# Patient Record
Sex: Female | Born: 1990 | Race: White | Hispanic: No | Marital: Single | Smoking: Never smoker
Health system: Southern US, Community
[De-identification: ages and names within clinical notes are randomized; demographics above are authoritative.]

## PROBLEM LIST (undated history)

## (undated) DIAGNOSIS — F410 Panic disorder [episodic paroxysmal anxiety] without agoraphobia: Secondary | ICD-10-CM

## (undated) HISTORY — DX: Panic disorder (episodic paroxysmal anxiety): F41.0

---
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
## 2011-05-24 IMAGING — CR DG CHEST 2V
2 series · 2 of 2 positions shown · non-contrast
Comparison: None

CLINICAL DATA: Chest pain, anxiety.

CHEST - 2 VIEW

[w chest pa]
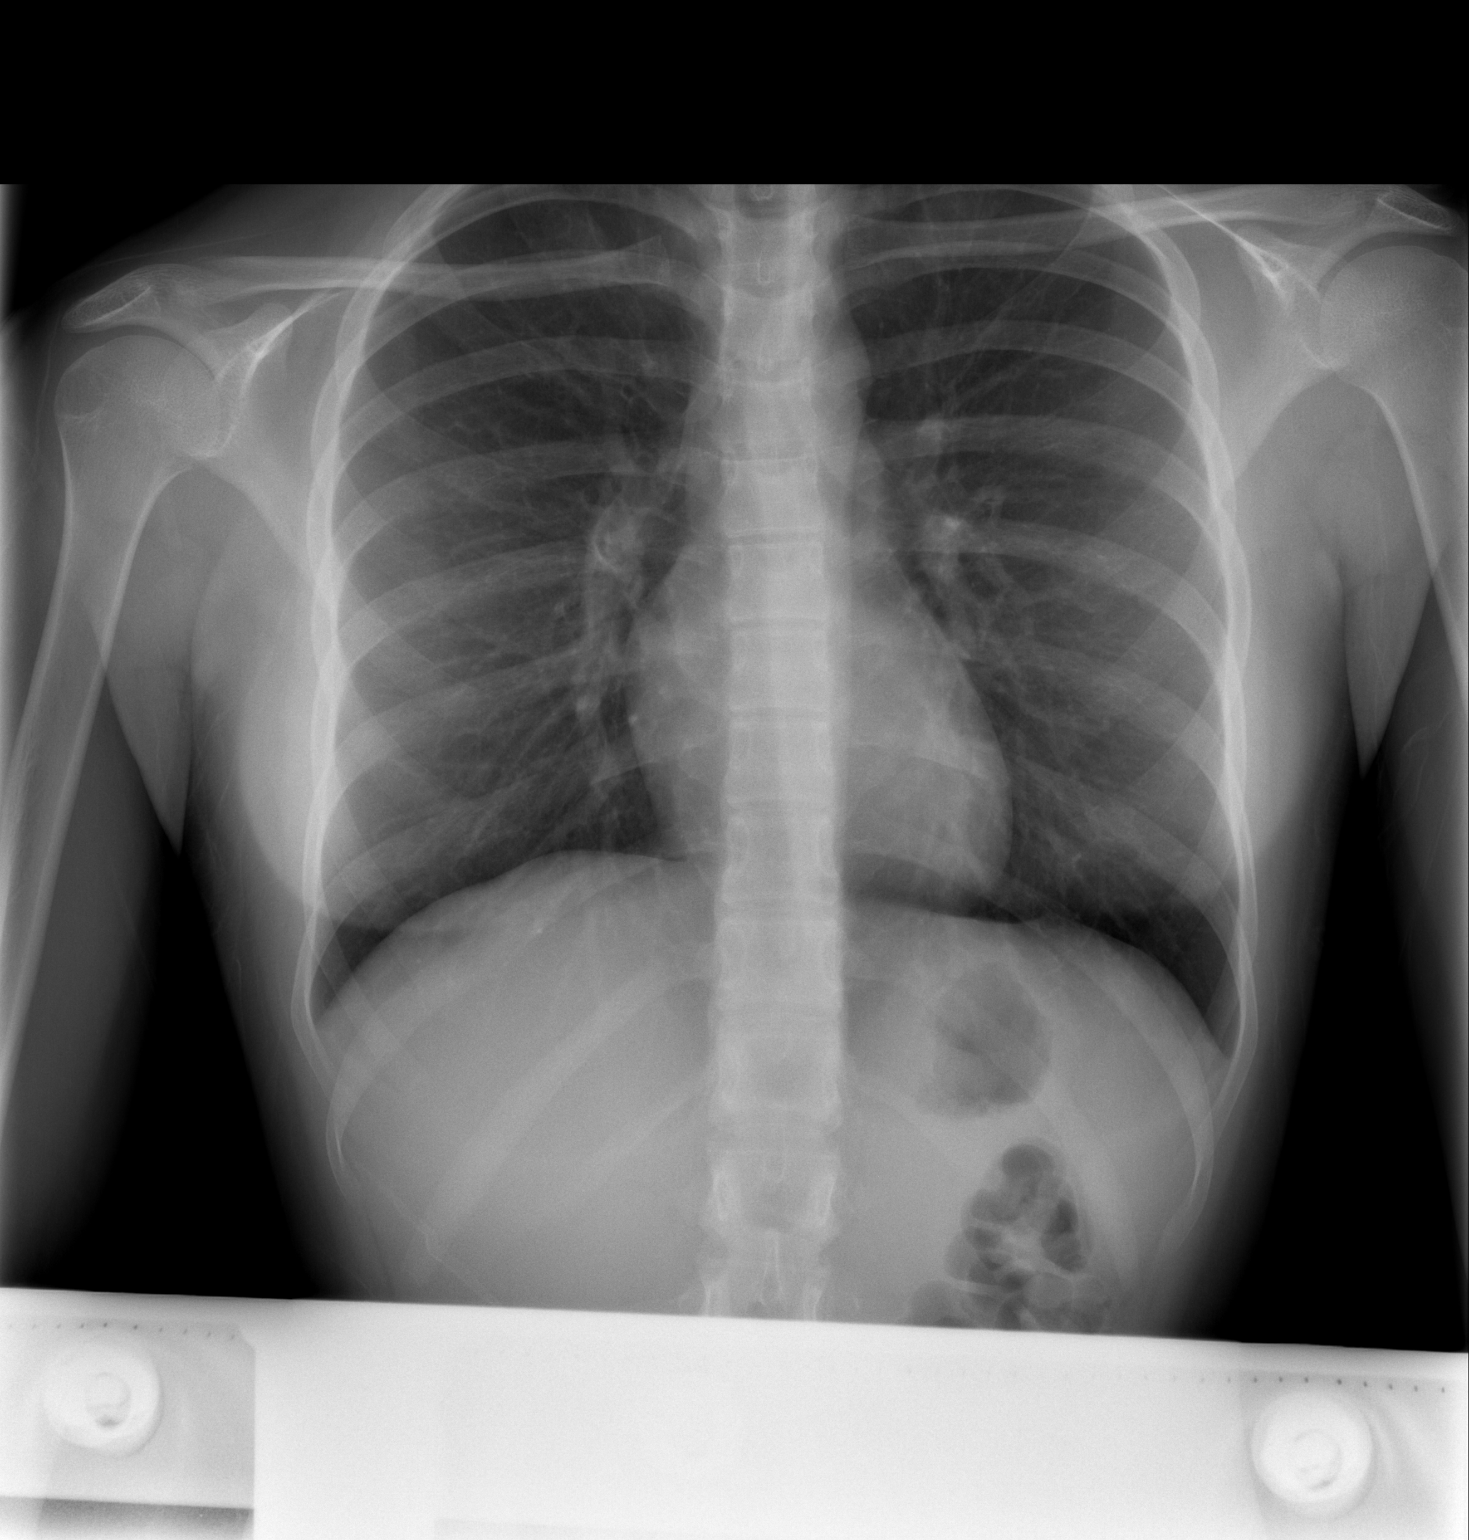

[w chest lat]
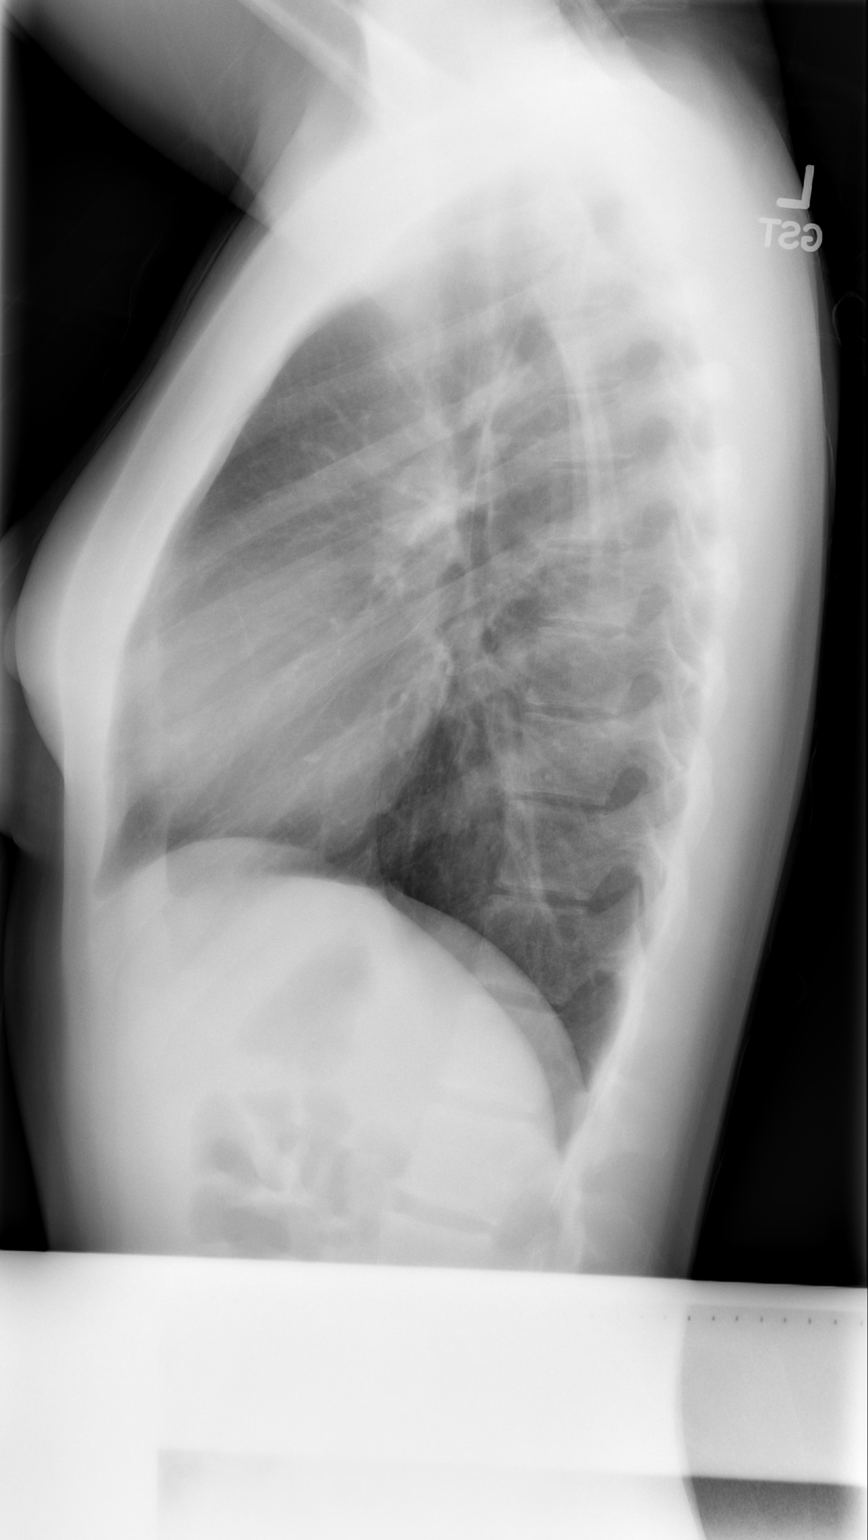

[2 of 2 positions shown; findings below may reference images not displayed]

FINDINGS: Heart and mediastinal contours are within normal limits.
No focal opacities or effusions.  No acute bony abnormality.
IMPRESSION: No active disease.

## 2012-12-08 ENCOUNTER — Encounter: Payer: Self-pay | Admitting: Certified Nurse Midwife

## 2012-12-08 ENCOUNTER — Telehealth: Payer: Self-pay | Admitting: Certified Nurse Midwife

## 2012-12-08 NOTE — Telephone Encounter (Signed)
Pt scheduled AEX for 12/08/12 @ 8:30 has enough refills to last her until then

## 2012-12-08 NOTE — Telephone Encounter (Signed)
Pt scheduled AEX for 12/09/12 @ 8:30 has enough refills to last her until then

## 2012-12-08 NOTE — Telephone Encounter (Signed)
Criselle/birth control/patient has been trying this for 3 month/reports it is working well and requests an rx please/[facility] [address]

## 2012-12-08 NOTE — Telephone Encounter (Signed)
Pt scheduled AEX for 12/08/12 @ 8:30 has enough refills to last her until then (previous message was error aex date)

## 2012-12-09 ENCOUNTER — Encounter: Payer: Self-pay | Admitting: Certified Nurse Midwife

## 2012-12-09 ENCOUNTER — Ambulatory Visit (INDEPENDENT_AMBULATORY_CARE_PROVIDER_SITE_OTHER): Payer: BC Managed Care – PPO | Admitting: Certified Nurse Midwife

## 2012-12-09 VITALS — BP 98/62 | Ht 58.25 in | Wt 102.0 lb

## 2012-12-09 DIAGNOSIS — Z01419 Encounter for gynecological examination (general) (routine) without abnormal findings: Secondary | ICD-10-CM

## 2012-12-09 DIAGNOSIS — Z0000 Encounter for general adult medical examination without abnormal findings: Secondary | ICD-10-CM

## 2012-12-09 DIAGNOSIS — IMO0001 Reserved for inherently not codable concepts without codable children: Secondary | ICD-10-CM

## 2012-12-09 DIAGNOSIS — Z309 Encounter for contraceptive management, unspecified: Secondary | ICD-10-CM

## 2012-12-09 LAB — POCT URINALYSIS DIPSTICK
Bilirubin, UA: NEGATIVE
Blood, UA: NEGATIVE
Ketones, UA: NEGATIVE
pH, UA: 5

## 2012-12-09 MED ORDER — NORGESTREL-ETHINYL ESTRADIOL 0.3-30 MG-MCG PO TABS: 1.0000 | ORAL_TABLET | Freq: Every day | ORAL | Status: DC

## 2012-12-09 NOTE — Progress Notes (Signed)
22 y.o. Single Hispanic female G0P0000 here for annual exam.  Periods normal.  Contraception working well desires continuance. No partner change, no STD screening desired. No family history or health change. No health issues today  Patient's last menstrual period was 11/18/2012.          Sexually active: yes  The current method of family planning is OCP (estrogen/progesterone).    Exercising: yes  Gym/ health club routine includes cardio. Last mammogram: none Last pap: none Last BMD: none Alcohol: none Tobacco: none   Health Maintenance  Topic Date Due  . Chlamydia Screening  06/15/2007  . Pap Smear  06/14/2010  . Tetanus/tdap  06/15/2011  . Influenza Vaccine  04/27/2013    Family History  Problem Relation Age of Onset  . Diabetes Maternal Grandmother   . Hypertension Maternal Grandmother     There is no problem list on file for this patient.   Past Medical History  Diagnosis Date  . Panic attack     Hospitalized    No past surgical history on file.  Allergies: Review of patient's allergies indicates no known allergies.  Current Outpatient Prescriptions  Medication Sig Dispense Refill  . CRYSELLE-28 0.3-30 MG-MCG tablet        No current facility-administered medications for this visit.    ROS: Pertinent items are noted in HPI.  Exam:    BP 98/62  Ht 4' 10.25" (1.48 m)  Wt 102 lb (46.267 kg)  BMI 21.12 kg/m2  LMP 11/18/2012 Weight change: @WEIGHTCHANGE @ Last 3 height recordings:  Ht Readings from Last 3 Encounters:  12/09/12 4' 10.25" (1.48 m)   General appearance: alert and cooperative Head: Normocephalic, without obvious abnormality, atraumatic Neck: no adenopathy, supple, symmetrical, trachea midline and thyroid not enlarged, symmetric, no tenderness/mass/nodules Lungs: clear to auscultation bilaterally Breasts: normal appearance, no masses or tenderness Heart: regular rate and rhythm Abdomen: soft, non-tender; bowel sounds normal; no masses,  no  organomegaly Extremities: extremities normal, atraumatic, no cyanosis or edema Skin: Skin color, texture, turgor normal. No rashes or lesions Lymph nodes: Cervical, supraclavicular, and axillary nodes normal. no inguinal nodes palpated Neurologic: Alert and oriented X 3, normal strength and tone. Normal symmetric reflexes. Normal coordination and gait   Pelvic: External genitalia:  no lesions              Urethra: normal appearing urethra with no masses, tenderness or lesions              Bartholins and Skenes: Bartholin's, Urethra, Skene's normal                 Vagina: normal appearing vagina with normal color and discharge, no lesions              Cervix: normal appearance              Pap taken: no        Bimanual Exam:  Uterus:  uterus is normal size, shape, consistency and nontender, anteverted                                      Adnexa:    normal adnexa in size, nontender and no masses                                      Rectovaginal: Confirms  Anus:  defer exam  A: Welll woman exam Contraception desires OCP     P:Reveiwed health and wellness pertinent to exam pap smear next year Rx Cryselle see order return annually or prn      An After Visit Summary was printed and given to the patient.  Reviewed, TL

## 2012-12-09 NOTE — Patient Instructions (Signed)

## 2013-02-13 ENCOUNTER — Telehealth: Payer: Self-pay | Admitting: Certified Nurse Midwife

## 2013-02-13 NOTE — Telephone Encounter (Signed)
Advised pt that 1 year supply of Cryselle was called at time of AEX on 11/2012.  Pt will call [facility] for refill.  Pt did not know she could call pharmacy for refill.

## 2013-02-13 NOTE — Telephone Encounter (Signed)
Patient needs refill on bc. Cryselle.

## 2013-10-25 ENCOUNTER — Other Ambulatory Visit: Payer: Self-pay | Admitting: Certified Nurse Midwife

## 2013-10-26 ENCOUNTER — Telehealth: Payer: Self-pay | Admitting: Certified Nurse Midwife

## 2013-10-26 NOTE — Telephone Encounter (Signed)
Pt wants to get a refill for norgestrel-ethinyl estradiol (CRYSELLE-28) 0.3-30 MG-MCG tablet . She states she was suppose to start taking it yesterday and would like to pick it up today.

## 2013-10-26 NOTE — Telephone Encounter (Signed)
12/09/12 #3 packs with 3 refills was sent through to pharmacy  S/w pharmacist they did receive that rx but patient no longer has any refills Called in Cryselle #3 packs with no refills (patient gets 90 day supply) Patient is aware.  Routed to provider, encounter closed.

## 2013-12-10 ENCOUNTER — Ambulatory Visit: Payer: BC Managed Care – PPO | Admitting: Certified Nurse Midwife

## 2013-12-16 ENCOUNTER — Encounter: Payer: Self-pay | Admitting: Certified Nurse Midwife

## 2013-12-16 ENCOUNTER — Ambulatory Visit (INDEPENDENT_AMBULATORY_CARE_PROVIDER_SITE_OTHER): Payer: BC Managed Care – PPO | Admitting: Certified Nurse Midwife

## 2013-12-16 VITALS — BP 94/60 | HR 68 | Resp 16 | Ht <= 58 in | Wt 96.0 lb

## 2013-12-16 DIAGNOSIS — Z309 Encounter for contraceptive management, unspecified: Secondary | ICD-10-CM

## 2013-12-16 DIAGNOSIS — Z01419 Encounter for gynecological examination (general) (routine) without abnormal findings: Secondary | ICD-10-CM

## 2013-12-16 DIAGNOSIS — Z0000 Encounter for general adult medical examination without abnormal findings: Secondary | ICD-10-CM

## 2013-12-16 MED ORDER — NORGESTREL-ETHINYL ESTRADIOL 0.3-30 MG-MCG PO TABS: 1.0000 | ORAL_TABLET | Freq: Every day | ORAL | Status: DC

## 2013-12-16 NOTE — Patient Instructions (Signed)
General topics  Next pap or exam is  due in 1 year Take a Women's multivitamin Take 1200 mg. of calcium daily - prefer dietary If any concerns in interim to call back  Breast Self-Awareness Practicing breast self-awareness may pick up problems early, prevent significant medical complications, and possibly save your life. By practicing breast self-awareness, you can become familiar with how your breasts look and feel and if your breasts are changing. This allows you to notice changes early. It can also offer you some reassurance that your breast health is good. One way to learn what is normal for your breasts and whether your breasts are changing is to do a breast self-exam. If you find a lump or something that was not present in the past, it is best to contact your caregiver right away. Other findings that should be evaluated by your caregiver include nipple discharge, especially if it is bloody; skin changes or reddening; areas where the skin seems to be pulled in (retracted); or new lumps and bumps. Breast pain is seldom associated with cancer (malignancy), but should also be evaluated by a caregiver. BREAST SELF-EXAM The best time to examine your breasts is 5 7 days after your menstrual period is over.  ExitCare Patient Information 2013 [employer].   Exercise to Stay Healthy Exercise helps you become and stay healthy. EXERCISE IDEAS AND TIPS Choose exercises that:  You enjoy.  Fit into your day. You do not need to exercise really hard to be healthy. You can do exercises at a slow or medium level and stay healthy. You can:  Stretch before and after working out.  Try yoga, Pilates, or tai chi.  Lift weights.  Walk fast, swim, jog, run, climb stairs, bicycle, dance, or rollerskate.  Take aerobic classes. Exercises that burn about 150 calories:  Running 1  miles in 15 minutes.  Playing volleyball for 45 to 60 minutes.  Washing and waxing a car for 45 to 60  minutes.  Playing touch football for 45 minutes.  Walking 1  miles in 35 minutes.  Pushing a stroller 1  miles in 30 minutes.  Playing basketball for 30 minutes.  Raking leaves for 30 minutes.  Bicycling 5 miles in 30 minutes.  Walking 2 miles in 30 minutes.  Dancing for 30 minutes.  Shoveling snow for 15 minutes.  Swimming laps for 20 minutes.  Walking up stairs for 15 minutes.  Bicycling 4 miles in 15 minutes.  Gardening for 30 to 45 minutes.  Jumping rope for 15 minutes.  Washing windows or floors for 45 to 60 minutes. Document Released: 09/15/2010 Document Revised: 11/05/2011 Document Reviewed: 09/15/2010 ExitCare Patient Information 2013 [employer].   Other topics ( that may be useful information):    Sexually Transmitted Disease Sexually transmitted disease (STD) refers to any infection that is passed from person to person during sexual activity. This may happen by way of saliva, semen, blood, vaginal mucus, or urine. Common STDs include:  Gonorrhea.  Chlamydia.  Syphilis.  HIV/AIDS.  Genital herpes.  Hepatitis B and C.  Trichomonas.  Human papillomavirus (HPV).  Pubic lice. CAUSES  An STD may be spread by bacteria, virus, or parasite. A person can get an STD by:  Sexual intercourse with an infected person.  Sharing sex toys with an infected person.  Sharing needles with an infected person.  Having intimate contact with the genitals, mouth, or rectal areas of an infected person. SYMPTOMS  Some people may not have any symptoms, but   they can still pass the infection to others. Different STDs have different symptoms. Symptoms include:  Painful or bloody urination.  Pain in the pelvis, abdomen, vagina, anus, throat, or eyes.  Skin rash, itching, irritation, growths, or sores (lesions). These usually occur in the genital or anal area.  Abnormal vaginal discharge.  Penile discharge in men.  Soft, flesh-colored skin growths in the  genital or anal area.  Fever.  Pain or bleeding during sexual intercourse.  Swollen glands in the groin area.  Yellow skin and eyes (jaundice). This is seen with hepatitis. DIAGNOSIS  To make a diagnosis, your caregiver may:  Take a medical history.  Perform a physical exam.  Take a specimen (culture) to be examined.  Examine a sample of discharge under a microscope.  Perform blood test TREATMENT   Chlamydia, gonorrhea, trichomonas, and syphilis can be cured with antibiotic medicine.  Genital herpes, hepatitis, and HIV can be treated, but not cured, with prescribed medicines. The medicines will lessen the symptoms.  Genital warts from HPV can be treated with medicine or by freezing, burning (electrocautery), or surgery. Warts may come back.  HPV is a virus and cannot be cured with medicine or surgery.However, abnormal areas may be followed very closely by your caregiver and may be removed from the cervix, vagina, or vulva through office procedures or surgery. If your diagnosis is confirmed, your recent sexual partners need treatment. This is true even if they are symptom-free or have a negative culture or evaluation. They should not have sex until their caregiver says it is okay. HOME CARE INSTRUCTIONS  All sexual partners should be informed, tested, and treated for all STDs.  Take your antibiotics as directed. Finish them even if you start to feel better.  Only take over-the-counter or prescription medicines for pain, discomfort, or fever as directed by your caregiver.  Rest.  Eat a balanced diet and drink enough fluids to keep your urine clear or pale yellow.  Do not have sex until treatment is completed and you have followed up with your caregiver. STDs should be checked after treatment.  Keep all follow-up appointments, Pap tests, and blood tests as directed by your caregiver.  Only use latex condoms and water-soluble lubricants during sexual activity. Do not use  petroleum jelly or oils.  Avoid alcohol and illegal drugs.  Get vaccinated for HPV and hepatitis. If you have not received these vaccines in the past, talk to your caregiver about whether one or both might be right for you.  Avoid risky sex practices that can break the skin. The only way to avoid getting an STD is to avoid all sexual activity.Latex condoms and dental dams (for oral sex) will help lessen the risk of getting an STD, but will not completely eliminate the risk. SEEK MEDICAL CARE IF:   You have a fever.  You have any new or worsening symptoms. Document Released: 11/03/2002 Document Revised: 11/05/2011 Document Reviewed: 11/10/2010 [facility] Patient Information 2013 [employer].    Domestic Abuse You are being battered or abused if someone close to you hits, pushes, or physically hurts you in any way. You also are being abused if you are forced into activities. You are being sexually abused if you are forced to have sexual contact of any kind. You are being emotionally abused if you are made to feel worthless or if you are constantly threatened. It is important to remember that help is available. No one has the right to abuse you. PREVENTION OF FURTHER  ABUSE  Learn the warning signs of danger. This varies with situations but may include: the use of alcohol, threats, isolation from friends and family, or forced sexual contact. Leave if you feel that violence is going to occur.  If you are attacked or beaten, report it to the police so the abuse is documented. You do not have to press charges. The police can protect you while you or the attackers are leaving. Get the officer's name and badge number and a copy of the report.  Find someone you can trust and tell them what is happening to you: your caregiver, a nurse, clergy member, close friend or family member. Feeling ashamed is natural, but remember that you have done nothing wrong. No one deserves abuse. Document Released:  08/10/2000 Document Revised: 11/05/2011 Document Reviewed: 10/19/2010 ExitCare Patient Information 2013 [employer].    How Much is Too Much Alcohol? Drinking too much alcohol can cause injury, accidents, and health problems. These types of problems can include:   Car crashes.  Falls.  Family fighting (domestic violence).  Drowning.  Fights.  Injuries.  Burns.  Damage to certain organs.  Having a baby with birth defects. ONE DRINK CAN BE TOO MUCH WHEN YOU ARE:  Working.  Pregnant or breastfeeding.  Taking medicines. Ask your doctor.  Driving or planning to drive. If you or someone you know has a drinking problem, get help from a doctor.  Document Released: 06/09/2009 Document Revised: 11/05/2011 Document Reviewed: 06/09/2009 ExitCare Patient Information 2013 [employer].   Smoking Hazards Smoking cigarettes is extremely bad for your health. Tobacco smoke has over 200 known poisons in it. There are over 60 chemicals in tobacco smoke that cause cancer. Some of the chemicals found in cigarette smoke include:   Cyanide.  Benzene.  Formaldehyde.  Methanol (wood alcohol).  Acetylene (fuel used in welding torches).  Ammonia. Cigarette smoke also contains the poisonous gases nitrogen oxide and carbon monoxide.  Cigarette smokers have an increased risk of many serious medical problems and Smoking causes approximately:  90% of all lung cancer deaths in men.  80% of all lung cancer deaths in women.  90% of deaths from chronic obstructive lung disease. Compared with nonsmokers, smoking increases the risk of:  Coronary heart disease by 2 to 4 times.  Stroke by 2 to 4 times.  Men developing lung cancer by 23 times.  Women developing lung cancer by 13 times.  Dying from chronic obstructive lung diseases by 12 times.  . Smoking is the most preventable cause of death and disease in our society.  WHY IS SMOKING ADDICTIVE?  Nicotine is the chemical  agent in tobacco that is capable of causing addiction or dependence.  When you smoke and inhale, nicotine is absorbed rapidly into the bloodstream through your lungs. Nicotine absorbed through the lungs is capable of creating a powerful addiction. Both inhaled and non-inhaled nicotine may be addictive.  Addiction studies of cigarettes and spit tobacco show that addiction to nicotine occurs mainly during the teen years, when young people begin using tobacco products. WHAT ARE THE BENEFITS OF QUITTING?  There are many health benefits to quitting smoking.   Likelihood of developing cancer and heart disease decreases. Health improvements are seen almost immediately.  Blood pressure, pulse rate, and breathing patterns start returning to normal soon after quitting. QUITTING SMOKING   American Lung Association - 1-800-LUNGUSA  American Cancer Society - 1-800-ACS-2345 Document Released: 09/20/2004 Document Revised: 11/05/2011 Document Reviewed: 05/25/2009 ExitCare Patient Information 2013 ExitCare,   LLC.   Stress Management Stress is a state of physical or mental tension that often results from changes in your life or normal routine. Some common causes of stress are:  Death of a loved one.  Injuries or severe illnesses.  Getting fired or changing jobs.  Moving into a new home. Other causes may be:  Sexual problems.  Business or financial losses.  Taking on a large debt.  Regular conflict with someone at home or at work.  Constant tiredness from lack of sleep. It is not just bad things that are stressful. It may be stressful to:  Win the lottery.  Get married.  Buy a new car. The amount of stress that can be easily tolerated varies from person to person. Changes generally cause stress, regardless of the types of change. Too much stress can affect your health. It may lead to physical or emotional problems. Too little stress (boredom) may also become stressful. SUGGESTIONS TO  REDUCE STRESS:  Talk things over with your family and friends. It often is helpful to share your concerns and worries. If you feel your problem is serious, you may want to get help from a professional counselor.  Consider your problems one at a time instead of lumping them all together. Trying to take care of everything at once may seem impossible. List all the things you need to do and then start with the most important one. Set a goal to accomplish 2 or 3 things each day. If you expect to do too many in a single day you will naturally fail, causing you to feel even more stressed.  Do not use alcohol or drugs to relieve stress. Although you may feel better for a short time, they do not remove the problems that caused the stress. They can also be habit forming.  Exercise regularly - at least 3 times per week. Physical exercise can help to relieve that "uptight" feeling and will relax you.  The shortest distance between despair and hope is often a good night's sleep.  Go to bed and get up on time allowing yourself time for appointments without being rushed.  Take a short "time-out" period from any stressful situation that occurs during the day. Close your eyes and take some deep breaths. Starting with the muscles in your face, tense them, hold it for a few seconds, then relax. Repeat this with the muscles in your neck, shoulders, hand, stomach, back and legs.  Take good care of yourself. Eat a balanced diet and get plenty of rest.  Schedule time for having fun. Take a break from your daily routine to relax. HOME CARE INSTRUCTIONS   Call if you feel overwhelmed by your problems and feel you can no longer manage them on your own.  Return immediately if you feel like hurting yourself or someone else. Document Released: 02/06/2001 Document Revised: 11/05/2011 Document Reviewed: 09/29/2007 ExitCare Patient Information 2013 [employer].  

## 2013-12-16 NOTE — Progress Notes (Signed)
23 y.o. G0P0000 Single Hispanic Fe here for annual exam. Periods normal, no issues. Contraception working well. Happy with choice. New partner desires GC,Chlamydia screen. 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 No health issues. Sees PCP prn.  Patient's last menstrual period was 12/14/2013.          Sexually active: yes  The current method of family planning is OCP (estrogen/progesterone).    Exercising: yes  weights & cardio Smoker:  no  Health Maintenance: Pap: none MMG:  none Colonoscopy:  none BMD:   none TDaP:  2011 Labs: none Self breast exam: not done   reports that she has never smoked. She does not have any smokeless tobacco history on file. She reports that she does not drink alcohol or use illicit drugs.  Past Medical History  Diagnosis Date  . Panic attack     Hospitalized    History reviewed. No pertinent past surgical history.  Current Outpatient Prescriptions  Medication Sig Dispense Refill  . norgestrel-ethinyl estradiol (CRYSELLE-28) 0.3-30 MG-MCG tablet Take 1 tablet by mouth daily.       No current facility-administered medications for this visit.    Family History  Problem Relation Age of Onset  . Diabetes Maternal Grandmother   . Hypertension Maternal Grandmother     ROS:  Pertinent items are noted in HPI.  Otherwise, a comprehensive ROS was negative.  Exam:   BP 94/60  Pulse 68  Resp 16  Ht 4\' 10"  (1.473 m)  Wt 96 lb (43.545 kg)  BMI 20.07 kg/m2  LMP 12/14/2013 Height: 4\' 10"  (147.3 cm)  Ht Readings from Last 3 Encounters:  12/16/13 4\' 10"  (1.473 m)  12/09/12 4' 10.25" (1.48 m)    General appearance: alert, cooperative and appears stated age Head: Normocephalic, without obvious abnormality, atraumatic Neck: no adenopathy, supple, symmetrical, trachea midline and thyroid normal to inspection and palpation and non-palpable Lungs: clear to auscultation bilaterally Breasts: normal appearance, no masses or tenderness, No  nipple retraction or dimpling, No nipple discharge or bleeding, No axillary or supraclavicular adenopathy Heart: regular rate and rhythm Abdomen: soft, non-tender; no masses,  no organomegaly Extremities: extremities normal, atraumatic, no cyanosis or edema Skin: Skin color, texture, turgor normal. No rashes or lesions Lymph nodes: Cervical, supraclavicular, and axillary nodes normal. No abnormal inguinal nodes palpated Neurologic: Grossly normal   Pelvic: External genitalia:  no lesions              Urethra:  normal appearing urethra with no masses, tenderness or lesions              Bartholin's and Skene's: normal                 Vagina: normal appearing vagina with normal color and discharge, no lesions              Cervix: normal, non tender              Pap taken: yes Bimanual Exam:  Uterus:  normal size, contour, position, consistency, mobility, non-tender and anteverted              Adnexa: normal adnexa and no mass, fullness, tenderness               Rectovaginal: Confirms               Anus: deferred  A:  Well Woman with normal exam  Contraception OCP desired  STD screening  P:   Reviewed health and  wellness pertinent to exam  Rx Cryselle see order  Pap smear taken today with HPV reflex  Lab:GC,Chlamydia  counseled on breast self exam, STD prevention, HIV risk factors and prevention, use and side effects of OCP's, adequate intake of calcium and vitamin D  Wished well with her graduation.  return annually or prn  An After Visit Summary was printed and given to the patient.

## 2013-12-18 NOTE — Progress Notes (Signed)
Reviewed personally.  M. Suzanne Tymier Lindholm, MD.  

## 2013-12-19 LAB — IPS N GONORRHOEA AND CHLAMYDIA BY PCR

## 2013-12-21 LAB — IPS PAP TEST WITH REFLEX TO HPV

## 2014-12-22 ENCOUNTER — Ambulatory Visit: Payer: BC Managed Care – PPO | Admitting: Certified Nurse Midwife

## 2015-06-24 ENCOUNTER — Ambulatory Visit (INDEPENDENT_AMBULATORY_CARE_PROVIDER_SITE_OTHER): Payer: BC Managed Care – PPO | Admitting: Certified Nurse Midwife

## 2015-06-24 ENCOUNTER — Encounter: Payer: Self-pay | Admitting: Certified Nurse Midwife

## 2015-06-24 VITALS — BP 118/58 | HR 89 | Resp 14 | Ht <= 58 in | Wt 99.0 lb

## 2015-06-24 DIAGNOSIS — R3915 Urgency of urination: Secondary | ICD-10-CM | POA: Diagnosis not present

## 2015-06-24 DIAGNOSIS — Z01419 Encounter for gynecological examination (general) (routine) without abnormal findings: Secondary | ICD-10-CM | POA: Diagnosis not present

## 2015-06-24 DIAGNOSIS — Z30011 Encounter for initial prescription of contraceptive pills: Secondary | ICD-10-CM

## 2015-06-24 LAB — POCT URINALYSIS DIPSTICK
Bilirubin, UA: NEGATIVE
GLUCOSE UA: NEGATIVE
Ketones, UA: NEGATIVE
Leukocytes, UA: NEGATIVE
Nitrite, UA: NEGATIVE
Protein, UA: NEGATIVE
RBC UA: NEGATIVE
UROBILINOGEN UA: NEGATIVE
pH, UA: 7

## 2015-06-24 MED ORDER — NORETHIN ACE-ETH ESTRAD-FE 1-20 MG-MCG(24) PO TABS: 1.0000 | ORAL_TABLET | Freq: Every day | ORAL | Status: DC

## 2015-06-24 NOTE — Patient Instructions (Addendum)
General topics  Next pap or exam is  due in 1 year Take a Women's multivitamin Take 1200 mg. of calcium daily - prefer dietary If any concerns in interim to call back  Breast Self-Awareness Practicing breast self-awareness may pick up problems early, prevent significant medical complications, and possibly save your life. By practicing breast self-awareness, you can become familiar with how your breasts look and feel and if your breasts are changing. This allows you to notice changes early. It can also offer you some reassurance that your breast health is good. One way to learn what is normal for your breasts and whether your breasts are changing is to do a breast self-exam. If you find a lump or something that was not present in the past, it is best to contact your caregiver right away. Other findings that should be evaluated by your caregiver include nipple discharge, especially if it is bloody; skin changes or reddening; areas where the skin seems to be pulled in (retracted); or new lumps and bumps. Breast pain is seldom associated with cancer (malignancy), but should also be evaluated by a caregiver. BREAST SELF-EXAM The best time to examine your breasts is 5 7 days after your menstrual period is over.  ExitCare Patient Information 2013 [employer].   Exercise to Stay Healthy Exercise helps you become and stay healthy. EXERCISE IDEAS AND TIPS Choose exercises that:  You enjoy.  Fit into your day. You do not need to exercise really hard to be healthy. You can do exercises at a slow or medium level and stay healthy. You can:  Stretch before and after working out.  Try yoga, Pilates, or tai chi.  Lift weights.  Walk fast, swim, jog, run, climb stairs, bicycle, dance, or rollerskate.  Take aerobic classes. Exercises that burn about 150 calories:  Running 1  miles in 15 minutes.  Playing volleyball for 45 to 60 minutes.  Washing and waxing a car for 45 to 60  minutes.  Playing touch football for 45 minutes.  Walking 1  miles in 35 minutes.  Pushing a stroller 1  miles in 30 minutes.  Playing basketball for 30 minutes.  Raking leaves for 30 minutes.  Bicycling 5 miles in 30 minutes.  Walking 2 miles in 30 minutes.  Dancing for 30 minutes.  Shoveling snow for 15 minutes.  Swimming laps for 20 minutes.  Walking up stairs for 15 minutes.  Bicycling 4 miles in 15 minutes.  Gardening for 30 to 45 minutes.  Jumping rope for 15 minutes.  Washing windows or floors for 45 to 60 minutes. Document Released: 09/15/2010 Document Revised: 11/05/2011 Document Reviewed: 09/15/2010 ExitCare Patient Information 2013 [employer].   Other topics ( that may be useful information):    Sexually Transmitted Disease Sexually transmitted disease (STD) refers to any infection that is passed from person to person during sexual activity. This may happen by way of saliva, semen, blood, vaginal mucus, or urine. Common STDs include:  Gonorrhea.  Chlamydia.  Syphilis.  HIV/AIDS.  Genital herpes.  Hepatitis B and C.  Trichomonas.  Human papillomavirus (HPV).  Pubic lice. CAUSES  An STD may be spread by bacteria, virus, or parasite. A person can get an STD by:  Sexual intercourse with an infected person.  Sharing sex toys with an infected person.  Sharing needles with an infected person.  Having intimate contact with the genitals, mouth, or rectal areas of an infected person. SYMPTOMS  Some people may not have any symptoms, but   they can still pass the infection to others. Different STDs have different symptoms. Symptoms include:  Painful or bloody urination.  Pain in the pelvis, abdomen, vagina, anus, throat, or eyes.  Skin rash, itching, irritation, growths, or sores (lesions). These usually occur in the genital or anal area.  Abnormal vaginal discharge.  Penile discharge in men.  Soft, flesh-colored skin growths in the  genital or anal area.  Fever.  Pain or bleeding during sexual intercourse.  Swollen glands in the groin area.  Yellow skin and eyes (jaundice). This is seen with hepatitis. DIAGNOSIS  To make a diagnosis, your caregiver may:  Take a medical history.  Perform a physical exam.  Take a specimen (culture) to be examined.  Examine a sample of discharge under a microscope.  Perform blood test TREATMENT   Chlamydia, gonorrhea, trichomonas, and syphilis can be cured with antibiotic medicine.  Genital herpes, hepatitis, and HIV can be treated, but not cured, with prescribed medicines. The medicines will lessen the symptoms.  Genital warts from HPV can be treated with medicine or by freezing, burning (electrocautery), or surgery. Warts may come back.  HPV is a virus and cannot be cured with medicine or surgery.However, abnormal areas may be followed very closely by your caregiver and may be removed from the cervix, vagina, or vulva through office procedures or surgery. If your diagnosis is confirmed, your recent sexual partners need treatment. This is true even if they are symptom-free or have a negative culture or evaluation. They should not have sex until their caregiver says it is okay. HOME CARE INSTRUCTIONS  All sexual partners should be informed, tested, and treated for all STDs.  Take your antibiotics as directed. Finish them even if you start to feel better.  Only take over-the-counter or prescription medicines for pain, discomfort, or fever as directed by your caregiver.  Rest.  Eat a balanced diet and drink enough fluids to keep your urine clear or pale yellow.  Do not have sex until treatment is completed and you have followed up with your caregiver. STDs should be checked after treatment.  Keep all follow-up appointments, Pap tests, and blood tests as directed by your caregiver.  Only use latex condoms and water-soluble lubricants during sexual activity. Do not use  petroleum jelly or oils.  Avoid alcohol and illegal drugs.  Get vaccinated for HPV and hepatitis. If you have not received these vaccines in the past, talk to your caregiver about whether one or both might be right for you.  Avoid risky sex practices that can break the skin. The only way to avoid getting an STD is to avoid all sexual activity.Latex condoms and dental dams (for oral sex) will help lessen the risk of getting an STD, but will not completely eliminate the risk. SEEK MEDICAL CARE IF:   You have a fever.  You have any new or worsening symptoms. Document Released: 11/03/2002 Document Revised: 11/05/2011 Document Reviewed: 11/10/2010 [facility] Patient Information 2013 [place].    Domestic Abuse You are being battered or abused if someone close to you hits, pushes, or physically hurts you in any way. You also are being abused if you are forced into activities. You are being sexually abused if you are forced to have sexual contact of any kind. You are being emotionally abused if you are made to feel worthless or if you are constantly threatened. It is important to remember that help is available. No one has the right to abuse you. PREVENTION OF FURTHER  ABUSE  Learn the warning signs of danger. This varies with situations but may include: the use of alcohol, threats, isolation from friends and family, or forced sexual contact. Leave if you feel that violence is going to occur.  If you are attacked or beaten, report it to the police so the abuse is documented. You do not have to press charges. The police can protect you while you or the attackers are leaving. Get the officer's name and badge number and a copy of the report.  Find someone you can trust and tell them what is happening to you: your caregiver, a nurse, clergy member, close friend or family member. Feeling ashamed is natural, but remember that you have done nothing wrong. No one deserves abuse. Document Released:  08/10/2000 Document Revised: 11/05/2011 Document Reviewed: 10/19/2010 ExitCare Patient Information 2013 [employer].    How Much is Too Much Alcohol? Drinking too much alcohol can cause injury, accidents, and health problems. These types of problems can include:   Car crashes.  Falls.  Family fighting (domestic violence).  Drowning.  Fights.  Injuries.  Burns.  Damage to certain organs.  Having a baby with birth defects. ONE DRINK CAN BE TOO MUCH WHEN YOU ARE:  Working.  Pregnant or breastfeeding.  Taking medicines. Ask your doctor.  Driving or planning to drive. If you or someone you know has a drinking problem, get help from a doctor.  Document Released: 06/09/2009 Document Revised: 11/05/2011 Document Reviewed: 06/09/2009 ExitCare Patient Information 2013 [employer].   Smoking Hazards Smoking cigarettes is extremely bad for your health. Tobacco smoke has over 200 known poisons in it. There are over 60 chemicals in tobacco smoke that cause cancer. Some of the chemicals found in cigarette smoke include:   Cyanide.  Benzene.  Formaldehyde.  Methanol (wood alcohol).  Acetylene (fuel used in welding torches).  Ammonia. Cigarette smoke also contains the poisonous gases nitrogen oxide and carbon monoxide.  Cigarette smokers have an increased risk of many serious medical problems and Smoking causes approximately:  90% of all lung cancer deaths in men.  80% of all lung cancer deaths in women.  90% of deaths from chronic obstructive lung disease. Compared with nonsmokers, smoking increases the risk of:  Coronary heart disease by 2 to 4 times.  Stroke by 2 to 4 times.  Men developing lung cancer by 23 times.  Women developing lung cancer by 13 times.  Dying from chronic obstructive lung diseases by 12 times.  . Smoking is the most preventable cause of death and disease in our society.  WHY IS SMOKING ADDICTIVE?  Nicotine is the chemical  agent in tobacco that is capable of causing addiction or dependence.  When you smoke and inhale, nicotine is absorbed rapidly into the bloodstream through your lungs. Nicotine absorbed through the lungs is capable of creating a powerful addiction. Both inhaled and non-inhaled nicotine may be addictive.  Addiction studies of cigarettes and spit tobacco show that addiction to nicotine occurs mainly during the teen years, when young people begin using tobacco products. WHAT ARE THE BENEFITS OF QUITTING?  There are many health benefits to quitting smoking.   Likelihood of developing cancer and heart disease decreases. Health improvements are seen almost immediately.  Blood pressure, pulse rate, and breathing patterns start returning to normal soon after quitting. QUITTING SMOKING   American Lung Association - 1-800-LUNGUSA  American Cancer Society - 1-800-ACS-2345 Document Released: 09/20/2004 Document Revised: 11/05/2011 Document Reviewed: 05/25/2009 ExitCare Patient Information 2013 ExitCare[employer]   LL[employer].   Stress Management Stress is a state of physical or mental tension that often results from changes in your life or normal routine. Some common causes of stress are:  Death of a loved one.  Injuries or severe illnesses.  Getting fired or changing jobs.  Moving into a new home. Other causes may be:  Sexual problems.  Business or financial losses.  Taking on a large debt.  Regular conflict with someone at home or at work.  Constant tiredness from lack of sleep. It is not just bad things that are stressful. It may be stressful to:  Win the lottery.  Get married.  Buy a new car. The amount of stress that can be easily tolerated varies from person to person. Changes generally cause stress, regardless of the types of change. Too much stress can affect your health. It may lead to physical or emotional problems. Too little stress (boredom) may also become stressful. SUGGESTIONS TO  REDUCE STRESS:  Talk things over with your family and friends. It often is helpful to share your concerns and worries. If you feel your problem is serious, you may want to get help from a professional counselor.  Consider your problems one at a time instead of lumping them all together. Trying to take care of everything at once may seem impossible. List all the things you need to do and then start with the most important one. Set a goal to accomplish 2 or 3 things each day. If you expect to do too many in a single day you will naturally fail, causing you to feel even more stressed.  Do not use alcohol or drugs to relieve stress. Although you may feel better for a short time, they do not remove the problems that caused the stress. They can also be habit forming.  Exercise regularly - at least 3 times per week. Physical exercise can help to relieve that "uptight" feeling and will relax you.  The shortest distance between despair and hope is often a good night's sleep.  Go to bed and get up on time allowing yourself time for appointments without being rushed.  Take a short "time-out" period from any stressful situation that occurs during the day. Close your eyes and take some deep breaths. Starting with the muscles in your face, tense them, hold it for a few seconds, then relax. Repeat this with the muscles in your neck, shoulders, hand, stomach, back and legs.  Take good care of yourself. Eat a balanced diet and get plenty of rest.  Schedule time for having fun. Take a break from your daily routine to relax. HOME CARE INSTRUCTIONS   Call if you feel overwhelmed by your problems and feel you can no longer manage them on your own.  Return immediately if you feel like hurting yourself or someone else. Document Released: 02/06/2001 Document Revised: 11/05/2011 Document Reviewed: 09/29/2007 [facility] Patient Information 2013 [place].  Urinary urgency

## 2015-06-24 NOTE — Progress Notes (Signed)
Patient ID: Virginia Morris, female   DOB: February 28, 1991, 24 y.o.   MRN: [id number] 24 y.o. [id number] Single  Hispanic Fe here for annual exam. Periods normal, no issues except for two periods in September and stopped OCP then. Normal period in October. Contraception is condoms and withdrawal. Patient would like to be back on OCP, but a change, did not like the PMS at end of pack. Complaining of urinary urgency to the point she is stopping at every bathroom to prevent possible urinary accident.Denies urinary pain or odor. Denies caffeine use only water. 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 Denies back pain, chills or fever or vaginal symptoms. Wears thongs only occasionally. Sees Urgent care if needed. No partner change or desire for STD screening. No other health issues today.   Patient's last menstrual period was 05/27/2015.          Sexually active: yes  The current method of family planning is none.    Exercising: Yes.    weights and cardio Smoker:  no  Health Maintenance: Pap:  12-16-13 MMG:  N/A Colonoscopy:  N/A BMD:   N/A TDaP:  08-27-09 Labs:  Self Breast Exams: No POCT urine: Negative   reports that she has never smoked. She has never used smokeless tobacco. She reports that she does not drink alcohol or use illicit drugs.  Past Medical History  Diagnosis Date  . Panic attack     Hospitalized    History reviewed. No pertinent past surgical history.  Current Outpatient Prescriptions  Medication Sig Dispense Refill  . norgestrel-ethinyl estradiol (CRYSELLE-28) 0.3-30 MG-MCG tablet Take 1 tablet by mouth daily. (Patient not taking: Reported on 06/24/2015) 3 Package 4   No current facility-administered medications for this visit.    Family History  Problem Relation Age of Onset  . Diabetes Maternal Grandmother   . Hypertension Maternal Grandmother     ROS:  Pertinent items are noted in HPI.  Otherwise, a comprehensive ROS was negative.  Exam:    BP 118/58 mmHg  Pulse 89  Resp 14  Ht 4\' 10"  (1.473 m)  Wt 99 lb (44.906 kg)  BMI 20.70 kg/m2  LMP 05/27/2015 Height: 4\' 10"  (147.3 cm) Ht Readings from Last 3 Encounters:  06/24/15 4\' 10"  (1.473 m)  12/16/13 4\' 10"  (1.473 m)  12/09/12 4' 10.25" (1.48 m)    General appearance: alert, cooperative and appears stated age Head: Normocephalic, without obvious abnormality, atraumatic Neck: no adenopathy, supple, symmetrical, trachea midline and thyroid normal to inspection and palpation Lungs: clear to auscultation bilaterally CVAT bilateral non tender Breasts: normal appearance, no masses or tenderness, No nipple retraction or dimpling, No nipple discharge or bleeding, No axillary or supraclavicular adenopathy Heart: regular rate and rhythm Abdomen: soft, non-tender; no masses,  no organomegaly, negative suprapubic tenderness Extremities: extremities normal, atraumatic, no cyanosis or edema Skin: Skin color, texture, turgor normal. No rashes or lesions, warm and dry Lymph nodes: Cervical, supraclavicular, and axillary nodes normal. No abnormal inguinal nodes palpated Neurologic: Grossly normal   Pelvic: External genitalia:  no lesions              Urethra:  normal appearing urethra with no masses, tenderness or lesions  Bladder and urethral meatus non tender              Bartholin's and Skene's: normal                 Vagina: normal appearing vagina with normal  color and discharge, no lesions              Cervix: normal appearance, no lesions, or tenderness              Pap taken: No. Bimanual Exam:  Uterus:  normal size, contour, position, consistency, mobility, non-tender and anteflexed              Adnexa: normal adnexa and no mass, fullness, tenderness               Rectovaginal: Confirms               Anus:  normal appearance  Chaperone present: yes  A:  Well Woman with normal exam  Contraception OCP desired again  Urinary urgency R/O UTI vs bladder memory frequency    P:   Reviewed health and wellness pertinent to exam  Rx Loestrin 24 Fe see order with instructions given for use, re evaluate in 3 months  Discussed doing urine culture to R/O UTI. Also discussed starting on Cranberry capsules bid daily. Try to hold bladder instead of emptying as soon as urge and emptying with out urge. Patient will try and report in two weeks status. May need referral to urology.  Pap smear as above not taken   counseled on breast self exam, STD prevention, HIV risk factors and prevention, use and side effects of OCP's, adequate intake of calcium and vitamin D, diet and exercise  return annually or prn  An After Visit Summary was printed and given to the patient.

## 2015-06-25 LAB — URINE CULTURE

## 2015-06-26 NOTE — Progress Notes (Signed)
Reviewed personally.  M. Suzanne Jacobo Moncrief, MD.  

## 2016-02-07 ENCOUNTER — Ambulatory Visit (INDEPENDENT_AMBULATORY_CARE_PROVIDER_SITE_OTHER): Payer: BC Managed Care – PPO | Admitting: Certified Nurse Midwife

## 2016-02-07 VITALS — BP 100/62 | HR 64 | Resp 14 | Wt 96.6 lb

## 2016-02-07 DIAGNOSIS — Z3201 Encounter for pregnancy test, result positive: Secondary | ICD-10-CM | POA: Diagnosis not present

## 2016-02-07 DIAGNOSIS — N912 Amenorrhea, unspecified: Secondary | ICD-10-CM

## 2016-02-07 DIAGNOSIS — Z789 Other specified health status: Secondary | ICD-10-CM | POA: Diagnosis not present

## 2016-02-07 DIAGNOSIS — N926 Irregular menstruation, unspecified: Secondary | ICD-10-CM

## 2016-02-07 LAB — POCT URINE PREGNANCY: PREG TEST UR: POSITIVE — AB

## 2016-02-07 NOTE — Patient Instructions (Signed)
Prenatal Care °WHAT IS PRENATAL CARE?  °Prenatal care is the process of caring for a pregnant woman before she gives birth. Prenatal care makes sure that she and her baby remain as healthy as possible throughout pregnancy. Prenatal care may be provided by a midwife, family practice health care provider, or a childbirth and pregnancy specialist (obstetrician). Prenatal care may include physical examinations, testing, treatments, and education on nutrition, lifestyle, and social support services. °WHY IS PRENATAL CARE SO IMPORTANT?  °Early and consistent prenatal care increases the chance that you and your baby will remain healthy throughout your pregnancy. This type of care also decreases a baby's risk of being born too early (prematurely), or being born smaller than expected (small for gestational age). Any underlying medical conditions you may have that could pose a risk during your pregnancy are discussed during prenatal care visits. You will also be monitored regularly for any new conditions that may arise during your pregnancy so they can be treated quickly and effectively. °WHAT HAPPENS DURING PRENATAL CARE VISITS? °Prenatal care visits may include the following: °Discussion °Tell your health care provider about any new signs or symptoms you have experienced since your last visit. These might include: °· Nausea or vomiting. °· Increased or decreased level of energy. °· Difficulty sleeping. °· Back or leg pain. °· Weight changes. °· Frequent urination. °· Shortness of breath with physical activity. °· Changes in your skin, such as the development of a rash or itchiness. °· Vaginal discharge or bleeding. °· Feelings of excitement or nervousness. °· Changes in your baby's movements. °You may want to write down any questions or topics you want to discuss with your health care provider and bring them with you to your appointment. °Examination °During your first prenatal care visit, you will likely have a complete  physical exam. Your health care provider will often examine your vagina, cervix, and the position of your uterus, as well as check your heart, lungs, and other body systems. As your pregnancy progresses, your health care provider will measure the size of your uterus and your baby's position inside your uterus. He or she may also examine you for early signs of labor. Your prenatal visits may also include checking your blood pressure and, after about 10-12 weeks of pregnancy, listening to your baby's heartbeat. °Testing °Regular testing often includes: °· Urinalysis. This checks your urine for glucose, protein, or signs of infection. °· Blood count. This checks the levels of white and red blood cells in your body. °· Tests for sexually transmitted infections (STIs). Testing for STIs at the beginning of pregnancy is routinely done and is required in many states. °· Antibody testing. You will be checked to see if you are immune to certain illnesses, such as rubella, that can affect a developing fetus. °· Glucose screen. Around 24-28 weeks of pregnancy, your blood glucose level will be checked for signs of gestational diabetes. Follow-up tests may be recommended. °· Group B strep. This is a bacteria that is commonly found inside a woman's vagina. This test will inform your health care provider if you need an antibiotic to reduce the amount of this bacteria in your body prior to labor and childbirth. °· Ultrasound. Many pregnant women undergo an ultrasound screening around 18-20 weeks of pregnancy to evaluate the health of the fetus and check for any developmental abnormalities. °· HIV (human immunodeficiency virus) testing. Early in your pregnancy, you will be screened for HIV. If you are at high risk for HIV, this test   may be repeated during your third trimester of pregnancy. °You may be offered other testing based on your age, personal or family medical history, or other factors.  °HOW OFTEN SHOULD I PLAN TO SEE MY  HEALTH CARE PROVIDER FOR PRENATAL CARE? °Your prenatal care check-up schedule depends on any medical conditions you have before, or develop during, your pregnancy. If you do not have any underlying medical conditions, you will likely be seen for checkups: °· Monthly, during the first 6 months of pregnancy. °· Twice a month during months 7 and 8 of pregnancy. °· Weekly starting in the 9th month of pregnancy and until delivery. °If you develop signs of early labor or other concerning signs or symptoms, you may need to see your health care provider more often. Ask your health care provider what prenatal care schedule is best for you. °WHAT CAN I DO TO KEEP MYSELF AND MY BABY AS HEALTHY AS POSSIBLE DURING MY PREGNANCY? °· Take a prenatal vitamin containing 400 micrograms (0.4 mg) of folic acid every day. Your health care provider may also ask you to take additional vitamins such as iodine, vitamin D, iron, copper, and zinc. °· Take 1500-2000 mg of calcium daily starting at your 20th week of pregnancy until you deliver your baby. °· Make sure you are up to date on your vaccinations. Unless directed otherwise by your health care provider: °¨ You should receive a tetanus, diphtheria, and pertussis (Tdap) vaccination between the 27th and 36th week of your pregnancy, regardless of when your last Tdap immunization occurred. This helps protect your baby from whooping cough (pertussis) after he or she is born. °¨ You should receive an annual inactivated influenza vaccine (IIV) to help protect you and your baby from influenza. This can be done at any point during your pregnancy. °· Eat a well-rounded diet that includes: °¨ Fresh fruits and vegetables. °¨ Lean proteins. °¨ Calcium-rich foods such as milk, yogurt, hard cheeses, and dark, leafy greens. °¨ Whole grain breads. °· Do not eat seafood high in mercury, including: °¨ Swordfish. °¨ Tilefish. °¨ Shark. °¨ King mackerel. °¨ More than 6 oz tuna per week. °· Do not eat: °¨ Raw  or undercooked meats or eggs. °¨ Unpasteurized foods, such as soft cheeses (brie, blue, or feta), juices, and milks. °¨ Lunch meats. °¨ Hot dogs that have not been heated until they are steaming. °· Drink enough water to keep your urine clear or pale yellow. For many women, this may be 10 or more 8 oz glasses of water each day. Keeping yourself hydrated helps deliver nutrients to your baby and may prevent the start of pre-term uterine contractions. °· Do not use any tobacco products including cigarettes, chewing tobacco, or electronic cigarettes. If you need help quitting, ask your health care provider. °· Do not drink beverages containing alcohol. No safe level of alcohol consumption during pregnancy has been determined. °· Do not use any illegal drugs. These can harm your developing baby or cause a miscarriage. °· Ask your health care provider or pharmacist before taking any prescription or over-the-counter medicines, herbs, or supplements. °· Limit your caffeine intake to no more than 200 mg per day. °· Exercise. Unless told otherwise by your health care provider, try to get 30 minutes of moderate exercise most days of the week. Do not  do high-impact activities, contact sports, or activities with a high risk of falling, such as horseback riding or downhill skiing. °· Get plenty of rest. °· Avoid anything that raises your   body temperature, such as hot tubs and saunas. °· If you own a cat, do not empty its litter box. Bacteria contained in cat feces can cause an infection called toxoplasmosis. This can result in serious harm to the fetus. °· Stay away from chemicals such as insecticides, lead, mercury, and cleaning or paint products that contain solvents. °· Do not have any X-rays taken unless medically necessary. °· Take a childbirth and breastfeeding preparation class. Ask your health care provider if you need a referral or recommendation. °  °This information is not intended to replace advice given to you by  your health care provider. Make sure you discuss any questions you have with your health care provider. °  °Document Released: 08/16/2003 Document Revised: 09/03/2014 Document Reviewed: 10/28/2013 °Elsevier Interactive Patient Education ©2016 [employer] ° °

## 2016-02-07 NOTE — Progress Notes (Signed)
25 y.o. single hispanic female g0p0 presents with amenorrhea with + UPT on 02/06/16. LMP 12/24/15.Not planned pregnancy using [facility] method for contraception.. Complaining of breast tenderness, fatigue only. Denies spotting, bleeding or cramping. Medications she is taking are: none  . Patient has consumed alcohol since + UPT.  Spouse to be supportive. Patient is a strict vegetarian, no fish or chicken. Eats beans/cheese for protein.   O: HPI pertinent to above. Healthy WDWN female Affect: normal, orientation x 3  Last Aex: Pap smear:             Rubella screen: drawn  A: Amenorrhea with positive UPT  6 wk 3 days per LNMP with [facility] 10/01/15. Unplanned pregnancy   P: Reviewed with patient importance of prenatal care during pregnancy. Given OB provider list. Reviewed nutrition importance of pregnancy and selecting from all food groups and making sure to have adequate protein intake daily. Discussed avoiding raw or exotic fish, soft cheeses due to risk of bacteria . Discussed concerns with FAS with alcohol use in pregnancy. Discussed increase of IUGR and SIDS with smoking use or second smoke. Reviewed warning signs of early pregnancy and need to advise if occurs. Discussed comfort measures for early pregnancy changes. Offered viability PUS here prior to initiating prenatal care. Patient would like to have PUS. She will be called with insurance information and scheduled. She is moving to [place], but will drive back for PUS, until she decides on prenatal care provider. Given information regarding CNM care per request. Questions addressed at length.    Rv prn  Time spent with patient in face to face counseling regarding pregnancy and prenatal care was 40 minutes.

## 2016-02-08 ENCOUNTER — Telehealth: Payer: Self-pay | Admitting: Certified Nurse Midwife

## 2016-02-08 ENCOUNTER — Encounter: Payer: Self-pay | Admitting: Certified Nurse Midwife

## 2016-02-08 DIAGNOSIS — Z789 Other specified health status: Secondary | ICD-10-CM | POA: Insufficient documentation

## 2016-02-08 LAB — RUBELLA SCREEN: RUBELLA: 6.18 {index} — AB (ref <0.90)

## 2016-02-08 NOTE — Telephone Encounter (Signed)
Spoke with patient. Advised she may schedule viability ultrasound here in the office and see Leota Sauerseborah Leonard CNM for a consultation (okay per SY). She is agreeable. Appointment scheduled for 02/16/2016 at 1:30 pm with 2 pm consult with Leota Sauerseborah Leonard CNM. She is agreeable to date and time.  Routing to provider for final review. Patient agreeable to disposition. Will close encounter.

## 2016-02-08 NOTE — Telephone Encounter (Signed)
Spoke with pt regarding benefit for ultrasound. Patient understood and agreeable to benefits. Advised patient the ultrasound appointment would be scheduled with one of our MD's,  patient was under the impression that she would see Virginia Morris for ultrasound appointment and is not agreeable to scheduling with another provider.   Routing to triage for review

## 2016-02-08 NOTE — Progress Notes (Signed)
Reviewed personally.  M. Suzanne Fernando Torry, MD.  

## 2016-02-08 NOTE — Telephone Encounter (Signed)
Patient returning call.

## 2016-02-14 ENCOUNTER — Telehealth: Payer: Self-pay | Admitting: Certified Nurse Midwife

## 2016-02-14 ENCOUNTER — Encounter: Payer: Self-pay | Admitting: Certified Nurse Midwife

## 2016-02-14 ENCOUNTER — Ambulatory Visit (INDEPENDENT_AMBULATORY_CARE_PROVIDER_SITE_OTHER): Payer: BC Managed Care – PPO | Admitting: Obstetrics & Gynecology

## 2016-02-14 ENCOUNTER — Ambulatory Visit (INDEPENDENT_AMBULATORY_CARE_PROVIDER_SITE_OTHER): Payer: BC Managed Care – PPO

## 2016-02-14 ENCOUNTER — Ambulatory Visit (INDEPENDENT_AMBULATORY_CARE_PROVIDER_SITE_OTHER): Payer: BC Managed Care – PPO | Admitting: Certified Nurse Midwife

## 2016-02-14 VITALS — BP 90/60 | HR 68 | Resp 16 | Ht <= 58 in | Wt 96.0 lb

## 2016-02-14 DIAGNOSIS — N912 Amenorrhea, unspecified: Secondary | ICD-10-CM

## 2016-02-14 DIAGNOSIS — Z3201 Encounter for pregnancy test, result positive: Secondary | ICD-10-CM

## 2016-02-14 NOTE — Patient Instructions (Signed)
Prenatal Care °WHAT IS PRENATAL CARE?  °Prenatal care is the process of caring for a pregnant woman before she gives birth. Prenatal care makes sure that she and her baby remain as healthy as possible throughout pregnancy. Prenatal care may be provided by a midwife, family practice health care provider, or a childbirth and pregnancy specialist (obstetrician). Prenatal care may include physical examinations, testing, treatments, and education on nutrition, lifestyle, and social support services. °WHY IS PRENATAL CARE SO IMPORTANT?  °Early and consistent prenatal care increases the chance that you and your baby will remain healthy throughout your pregnancy. This type of care also decreases a baby's risk of being born too early (prematurely), or being born smaller than expected (small for gestational age). Any underlying medical conditions you may have that could pose a risk during your pregnancy are discussed during prenatal care visits. You will also be monitored regularly for any new conditions that may arise during your pregnancy so they can be treated quickly and effectively. °WHAT HAPPENS DURING PRENATAL CARE VISITS? °Prenatal care visits may include the following: °Discussion °Tell your health care provider about any new signs or symptoms you have experienced since your last visit. These might include: °· Nausea or vomiting. °· Increased or decreased level of energy. °· Difficulty sleeping. °· Back or leg pain. °· Weight changes. °· Frequent urination. °· Shortness of breath with physical activity. °· Changes in your skin, such as the development of a rash or itchiness. °· Vaginal discharge or bleeding. °· Feelings of excitement or nervousness. °· Changes in your baby's movements. °You may want to write down any questions or topics you want to discuss with your health care provider and bring them with you to your appointment. °Examination °During your first prenatal care visit, you will likely have a complete  physical exam. Your health care provider will often examine your vagina, cervix, and the position of your uterus, as well as check your heart, lungs, and other body systems. As your pregnancy progresses, your health care provider will measure the size of your uterus and your baby's position inside your uterus. He or she may also examine you for early signs of labor. Your prenatal visits may also include checking your blood pressure and, after about 10-12 weeks of pregnancy, listening to your baby's heartbeat. °Testing °Regular testing often includes: °· Urinalysis. This checks your urine for glucose, protein, or signs of infection. °· Blood count. This checks the levels of white and red blood cells in your body. °· Tests for sexually transmitted infections (STIs). Testing for STIs at the beginning of pregnancy is routinely done and is required in many states. °· Antibody testing. You will be checked to see if you are immune to certain illnesses, such as rubella, that can affect a developing fetus. °· Glucose screen. Around 24-28 weeks of pregnancy, your blood glucose level will be checked for signs of gestational diabetes. Follow-up tests may be recommended. °· Group B strep. This is a bacteria that is commonly found inside a woman's vagina. This test will inform your health care provider if you need an antibiotic to reduce the amount of this bacteria in your body prior to labor and childbirth. °· Ultrasound. Many pregnant women undergo an ultrasound screening around 18-20 weeks of pregnancy to evaluate the health of the fetus and check for any developmental abnormalities. °· HIV (human immunodeficiency virus) testing. Early in your pregnancy, you will be screened for HIV. If you are at high risk for HIV, this test   may be repeated during your third trimester of pregnancy. °You may be offered other testing based on your age, personal or family medical history, or other factors.  °HOW OFTEN SHOULD I PLAN TO SEE MY  HEALTH CARE PROVIDER FOR PRENATAL CARE? °Your prenatal care check-up schedule depends on any medical conditions you have before, or develop during, your pregnancy. If you do not have any underlying medical conditions, you will likely be seen for checkups: °· Monthly, during the first 6 months of pregnancy. °· Twice a month during months 7 and 8 of pregnancy. °· Weekly starting in the 9th month of pregnancy and until delivery. °If you develop signs of early labor or other concerning signs or symptoms, you may need to see your health care provider more often. Ask your health care provider what prenatal care schedule is best for you. °WHAT CAN I DO TO KEEP MYSELF AND MY BABY AS HEALTHY AS POSSIBLE DURING MY PREGNANCY? °· Take a prenatal vitamin containing 400 micrograms (0.4 mg) of folic acid every day. Your health care provider may also ask you to take additional vitamins such as iodine, vitamin D, iron, copper, and zinc. °· Take 1500-2000 mg of calcium daily starting at your 20th week of pregnancy until you deliver your baby. °· Make sure you are up to date on your vaccinations. Unless directed otherwise by your health care provider: °¨ You should receive a tetanus, diphtheria, and pertussis (Tdap) vaccination between the 27th and 36th week of your pregnancy, regardless of when your last Tdap immunization occurred. This helps protect your baby from whooping cough (pertussis) after he or she is born. °¨ You should receive an annual inactivated influenza vaccine (IIV) to help protect you and your baby from influenza. This can be done at any point during your pregnancy. °· Eat a well-rounded diet that includes: °¨ Fresh fruits and vegetables. °¨ Lean proteins. °¨ Calcium-rich foods such as milk, yogurt, hard cheeses, and dark, leafy greens. °¨ Whole grain breads. °· Do not eat seafood high in mercury, including: °¨ Swordfish. °¨ Tilefish. °¨ Shark. °¨ King mackerel. °¨ More than 6 oz tuna per week. °· Do not eat: °¨ Raw  or undercooked meats or eggs. °¨ Unpasteurized foods, such as soft cheeses (brie, blue, or feta), juices, and milks. °¨ Lunch meats. °¨ Hot dogs that have not been heated until they are steaming. °· Drink enough water to keep your urine clear or pale yellow. For many women, this may be 10 or more 8 oz glasses of water each day. Keeping yourself hydrated helps deliver nutrients to your baby and may prevent the start of pre-term uterine contractions. °· Do not use any tobacco products including cigarettes, chewing tobacco, or electronic cigarettes. If you need help quitting, ask your health care provider. °· Do not drink beverages containing alcohol. No safe level of alcohol consumption during pregnancy has been determined. °· Do not use any illegal drugs. These can harm your developing baby or cause a miscarriage. °· Ask your health care provider or pharmacist before taking any prescription or over-the-counter medicines, herbs, or supplements. °· Limit your caffeine intake to no more than 200 mg per day. °· Exercise. Unless told otherwise by your health care provider, try to get 30 minutes of moderate exercise most days of the week. Do not  do high-impact activities, contact sports, or activities with a high risk of falling, such as horseback riding or downhill skiing. °· Get plenty of rest. °· Avoid anything that raises your   body temperature, such as hot tubs and saunas. °· If you own a cat, do not empty its litter box. Bacteria contained in cat feces can cause an infection called toxoplasmosis. This can result in serious harm to the fetus. °· Stay away from chemicals such as insecticides, lead, mercury, and cleaning or paint products that contain solvents. °· Do not have any X-rays taken unless medically necessary. °· Take a childbirth and breastfeeding preparation class. Ask your health care provider if you need a referral or recommendation. °  °This information is not intended to replace advice given to you by  your health care provider. Make sure you discuss any questions you have with your health care provider. °  °Document Released: 08/16/2003 Document Revised: 09/03/2014 Document Reviewed: 10/28/2013 °Elsevier Interactive Patient Education ©2016 [employer] ° °

## 2016-02-14 NOTE — Progress Notes (Signed)
Pt seen by D. Darcel BayleyLeonard on date of service.  Images reviewed with Ortencia Kick. Leonard, CNM.  Pt will return for PUS in about 10 days.

## 2016-02-14 NOTE — Progress Notes (Signed)
24 y.o.marital status Hispanic female g0p0 presents with amenorrhea with + UPT on 02/07/16,   LMP 12/24/15. Planned pregnancy. Taking prenatal vitamins. Denies bleeding or pain. Here for PUS to confirm viability.   O: HPI pertinent to above. Healthy WDWN female Affect: normal, orientation x 3  PUS results: see Imaging report Summary: Normal size anteverted uterus Single Intrauterine sac, centrally located Size does not equal dates 2 week discrepancy Yolk sac seen but too early for cardiac activity or emybroinic pole measurement Adnexa within normal limits with ovary consistent with PCOS appearance Cervix closed Corpus Luteum noted on Left side 15 mm No free fluid noted.  A: Intrauterine pregnancy with size /date discrepancy per LMP 9334w3d, [facility].  P: Reviewed findings with patient/spouse and mother,and discussed size/ discrepancy and new EDC of 10/13/16. Discussed to early to see fetal heartbeat and will need to repeat again late next week. Questions addressed and discussed PCOS appearance of ovary and ovulation pattern can change with this. Warning signs of pregnancy given and need to advise if occurs. Patient will be called with PUS appointment for 02/23/16 if possible.  RV as above, prn  20 minutes spent with patient and family regarding pregnancy PUS and size/date change.

## 2016-02-14 NOTE — Telephone Encounter (Signed)
Patient is returning a call to Virginia Morris. Patient said she was taking with [name] late yesterday afternoon and the call was disconnected.

## 2016-02-15 NOTE — Telephone Encounter (Signed)
Spoke with patient later same afternoon. Patient was scheduled and has completed her appointment.

## 2016-02-15 NOTE — Progress Notes (Signed)
Reviewed personally.  M. Suzanne Okla Qazi, MD.  

## 2016-02-16 ENCOUNTER — Other Ambulatory Visit: Payer: BC Managed Care – PPO | Admitting: Certified Nurse Midwife

## 2016-02-16 ENCOUNTER — Other Ambulatory Visit: Payer: BC Managed Care – PPO

## 2016-02-17 ENCOUNTER — Encounter: Payer: Self-pay | Admitting: Obstetrics & Gynecology

## 2016-02-23 ENCOUNTER — Ambulatory Visit (INDEPENDENT_AMBULATORY_CARE_PROVIDER_SITE_OTHER): Payer: BC Managed Care – PPO

## 2016-02-23 ENCOUNTER — Ambulatory Visit (INDEPENDENT_AMBULATORY_CARE_PROVIDER_SITE_OTHER): Payer: BC Managed Care – PPO | Admitting: Certified Nurse Midwife

## 2016-02-23 ENCOUNTER — Encounter: Payer: Self-pay | Admitting: Certified Nurse Midwife

## 2016-02-23 VITALS — BP 100/64 | HR 68 | Resp 16 | Ht <= 58 in | Wt 96.0 lb

## 2016-02-23 DIAGNOSIS — N912 Amenorrhea, unspecified: Secondary | ICD-10-CM | POA: Diagnosis not present

## 2016-02-23 DIAGNOSIS — O26841 Uterine size-date discrepancy, first trimester: Secondary | ICD-10-CM

## 2016-02-23 DIAGNOSIS — Z3201 Encounter for pregnancy test, result positive: Secondary | ICD-10-CM | POA: Diagnosis not present

## 2016-02-23 NOTE — Addendum Note (Signed)
Addended by: Joeseph AmorFAST, Aryia Delira L on: 02/23/2016 08:16 AM   Modules accepted: Orders

## 2016-02-23 NOTE — Patient Instructions (Signed)
Prenatal Care °WHAT IS PRENATAL CARE?  °Prenatal care is the process of caring for a pregnant woman before she gives birth. Prenatal care makes sure that she and her baby remain as healthy as possible throughout pregnancy. Prenatal care may be provided by a midwife, family practice health care provider, or a childbirth and pregnancy specialist (obstetrician). Prenatal care may include physical examinations, testing, treatments, and education on nutrition, lifestyle, and social support services. °WHY IS PRENATAL CARE SO IMPORTANT?  °Early and consistent prenatal care increases the chance that you and your baby will remain healthy throughout your pregnancy. This type of care also decreases a baby's risk of being born too early (prematurely), or being born smaller than expected (small for gestational age). Any underlying medical conditions you may have that could pose a risk during your pregnancy are discussed during prenatal care visits. You will also be monitored regularly for any new conditions that may arise during your pregnancy so they can be treated quickly and effectively. °WHAT HAPPENS DURING PRENATAL CARE VISITS? °Prenatal care visits may include the following: °Discussion °Tell your health care provider about any new signs or symptoms you have experienced since your last visit. These might include: °· Nausea or vomiting. °· Increased or decreased level of energy. °· Difficulty sleeping. °· Back or leg pain. °· Weight changes. °· Frequent urination. °· Shortness of breath with physical activity. °· Changes in your skin, such as the development of a rash or itchiness. °· Vaginal discharge or bleeding. °· Feelings of excitement or nervousness. °· Changes in your baby's movements. °You may want to write down any questions or topics you want to discuss with your health care provider and bring them with you to your appointment. °Examination °During your first prenatal care visit, you will likely have a complete  physical exam. Your health care provider will often examine your vagina, cervix, and the position of your uterus, as well as check your heart, lungs, and other body systems. As your pregnancy progresses, your health care provider will measure the size of your uterus and your baby's position inside your uterus. He or she may also examine you for early signs of labor. Your prenatal visits may also include checking your blood pressure and, after about 10-12 weeks of pregnancy, listening to your baby's heartbeat. °Testing °Regular testing often includes: °· Urinalysis. This checks your urine for glucose, protein, or signs of infection. °· Blood count. This checks the levels of white and red blood cells in your body. °· Tests for sexually transmitted infections (STIs). Testing for STIs at the beginning of pregnancy is routinely done and is required in many states. °· Antibody testing. You will be checked to see if you are immune to certain illnesses, such as rubella, that can affect a developing fetus. °· Glucose screen. Around 24-28 weeks of pregnancy, your blood glucose level will be checked for signs of gestational diabetes. Follow-up tests may be recommended. °· Group B strep. This is a bacteria that is commonly found inside a woman's vagina. This test will inform your health care provider if you need an antibiotic to reduce the amount of this bacteria in your body prior to labor and childbirth. °· Ultrasound. Many pregnant women undergo an ultrasound screening around 18-20 weeks of pregnancy to evaluate the health of the fetus and check for any developmental abnormalities. °· HIV (human immunodeficiency virus) testing. Early in your pregnancy, you will be screened for HIV. If you are at high risk for HIV, this test   may be repeated during your third trimester of pregnancy. °You may be offered other testing based on your age, personal or family medical history, or other factors.  °HOW OFTEN SHOULD I PLAN TO SEE MY  HEALTH CARE PROVIDER FOR PRENATAL CARE? °Your prenatal care check-up schedule depends on any medical conditions you have before, or develop during, your pregnancy. If you do not have any underlying medical conditions, you will likely be seen for checkups: °· Monthly, during the first 6 months of pregnancy. °· Twice a month during months 7 and 8 of pregnancy. °· Weekly starting in the 9th month of pregnancy and until delivery. °If you develop signs of early labor or other concerning signs or symptoms, you may need to see your health care provider more often. Ask your health care provider what prenatal care schedule is best for you. °WHAT CAN I DO TO KEEP MYSELF AND MY BABY AS HEALTHY AS POSSIBLE DURING MY PREGNANCY? °· Take a prenatal vitamin containing 400 micrograms (0.4 mg) of folic acid every day. Your health care provider may also ask you to take additional vitamins such as iodine, vitamin D, iron, copper, and zinc. °· Take 1500-2000 mg of calcium daily starting at your 20th week of pregnancy until you deliver your baby. °· Make sure you are up to date on your vaccinations. Unless directed otherwise by your health care provider: °¨ You should receive a tetanus, diphtheria, and pertussis (Tdap) vaccination between the 27th and 36th week of your pregnancy, regardless of when your last Tdap immunization occurred. This helps protect your baby from whooping cough (pertussis) after he or she is born. °¨ You should receive an annual inactivated influenza vaccine (IIV) to help protect you and your baby from influenza. This can be done at any point during your pregnancy. °· Eat a well-rounded diet that includes: °¨ Fresh fruits and vegetables. °¨ Lean proteins. °¨ Calcium-rich foods such as milk, yogurt, hard cheeses, and dark, leafy greens. °¨ Whole grain breads. °· Do not eat seafood high in mercury, including: °¨ Swordfish. °¨ Tilefish. °¨ Shark. °¨ King mackerel. °¨ More than 6 oz tuna per week. °· Do not eat: °¨ Raw  or undercooked meats or eggs. °¨ Unpasteurized foods, such as soft cheeses (brie, blue, or feta), juices, and milks. °¨ Lunch meats. °¨ Hot dogs that have not been heated until they are steaming. °· Drink enough water to keep your urine clear or pale yellow. For many women, this may be 10 or more 8 oz glasses of water each day. Keeping yourself hydrated helps deliver nutrients to your baby and may prevent the start of pre-term uterine contractions. °· Do not use any tobacco products including cigarettes, chewing tobacco, or electronic cigarettes. If you need help quitting, ask your health care provider. °· Do not drink beverages containing alcohol. No safe level of alcohol consumption during pregnancy has been determined. °· Do not use any illegal drugs. These can harm your developing baby or cause a miscarriage. °· Ask your health care provider or pharmacist before taking any prescription or over-the-counter medicines, herbs, or supplements. °· Limit your caffeine intake to no more than 200 mg per day. °· Exercise. Unless told otherwise by your health care provider, try to get 30 minutes of moderate exercise most days of the week. Do not  do high-impact activities, contact sports, or activities with a high risk of falling, such as horseback riding or downhill skiing. °· Get plenty of rest. °· Avoid anything that raises your   body temperature, such as hot tubs and saunas. °· If you own a cat, do not empty its litter box. Bacteria contained in cat feces can cause an infection called toxoplasmosis. This can result in serious harm to the fetus. °· Stay away from chemicals such as insecticides, lead, mercury, and cleaning or paint products that contain solvents. °· Do not have any X-rays taken unless medically necessary. °· Take a childbirth and breastfeeding preparation class. Ask your health care provider if you need a referral or recommendation. °  °This information is not intended to replace advice given to you by  your health care provider. Make sure you discuss any questions you have with your health care provider. °  °Document Released: 08/16/2003 Document Revised: 09/03/2014 Document Reviewed: 10/28/2013 °Elsevier Interactive Patient Education ©2016 [employer] ° °

## 2016-02-23 NOTE — Progress Notes (Signed)
     25 yo married Hispanic female g0p0 here today for follow up PUS. LMP 12/24/15, but PUS on 02/14/16 indicated per size and no cardiac activity size/ date discrepancy. Patient doing well with increase nausea, no vomiting. Using frequent meals to help with. Denies vaginal bleeding or cramping since last appointment.  O: HPI pertinent to above Healthy WDWN female Affect: normal  PUS results: see Imaging report as above  Summary Single yolk sac centrally located with fetal cardiac activity noted and recorded. Normal retroverted uterus with no masses noted. CL cyst noted on left side, no free fluid. Adnexa WNL  A: Size date discrepancy. Gestational age per view today 6+1 day  P: Discussed and reviewed images with spouse, mother and patient. Discussed size date discrepancy but FHT noted. Patient encouraged to schedule OB care now. Plans OB care in [place], possibly [facility]. Patient will advise when scheduled. Questions answered and comfort measures for nausea given. Warning signs of early pregnancy reviewed and need to advise. Wished well. Rv prn  20 minutes of face to face time regarding pregnancy status.

## 2016-02-24 NOTE — Progress Notes (Signed)
Reviewed personally.  M. Suzanne Deane Melick, MD.  

## 2016-06-28 ENCOUNTER — Ambulatory Visit: Payer: BC Managed Care – PPO | Admitting: Certified Nurse Midwife

## 2016-12-03 ENCOUNTER — Telehealth: Payer: Self-pay | Admitting: Certified Nurse Midwife

## 2016-12-03 NOTE — Telephone Encounter (Signed)
Spoke with patient. Patient gave birth to her child on 10/19/2016. Patient was cleared by her midwife at her 6 week post partum visit to return to GYN care. Patient would like a pap smear and to discuss nexplanon insertion. Patient has questions about insurance coverage with this visit. All questions reviewed with the front desk staff and all questions answered. Patient would like to figure out her insurance coverage moving forward and return call to the office.  Routing to provider for final review. Patient agreeable to disposition. Will close encounter.

## 2016-12-03 NOTE — Telephone Encounter (Signed)
1. Patient called and reported she had a baby a few months ago. She is now calling to schedule a "post partum Pap" with our office.  2. Patient is also interested in getting a Nexplanon.
